# Patient Record
Sex: Male | Born: 1935 | Race: Black or African American | Hispanic: No | Marital: Single | Smoking: Never smoker
Health system: Southern US, Community
[De-identification: ages and names within clinical notes are randomized; demographics above are authoritative.]

## PROBLEM LIST (undated history)

## (undated) DIAGNOSIS — I10 Essential (primary) hypertension: Secondary | ICD-10-CM

## (undated) DIAGNOSIS — M6282 Rhabdomyolysis: Secondary | ICD-10-CM

## (undated) DIAGNOSIS — D529 Folate deficiency anemia, unspecified: Secondary | ICD-10-CM

## (undated) DIAGNOSIS — F329 Major depressive disorder, single episode, unspecified: Secondary | ICD-10-CM

## (undated) DIAGNOSIS — L723 Sebaceous cyst: Secondary | ICD-10-CM

## (undated) DIAGNOSIS — I7090 Unspecified atherosclerosis: Secondary | ICD-10-CM

## (undated) DIAGNOSIS — D638 Anemia in other chronic diseases classified elsewhere: Secondary | ICD-10-CM

## (undated) DIAGNOSIS — IMO0002 Reserved for concepts with insufficient information to code with codable children: Secondary | ICD-10-CM

## (undated) DIAGNOSIS — M79609 Pain in unspecified limb: Secondary | ICD-10-CM

## (undated) DIAGNOSIS — F3289 Other specified depressive episodes: Secondary | ICD-10-CM

## (undated) DIAGNOSIS — K922 Gastrointestinal hemorrhage, unspecified: Secondary | ICD-10-CM

## (undated) DIAGNOSIS — F1027 Alcohol dependence with alcohol-induced persisting dementia: Secondary | ICD-10-CM

## (undated) DIAGNOSIS — N189 Chronic kidney disease, unspecified: Secondary | ICD-10-CM

## (undated) HISTORY — DX: Unspecified atherosclerosis: I70.90

## (undated) HISTORY — DX: Chronic kidney disease, unspecified: N18.9

## (undated) HISTORY — DX: Other specified depressive episodes: F32.89

## (undated) HISTORY — DX: Essential (primary) hypertension: I10

## (undated) HISTORY — DX: Anemia in other chronic diseases classified elsewhere: D63.8

## (undated) HISTORY — DX: Alcohol dependence with alcohol-induced persisting dementia: F10.27

## (undated) HISTORY — DX: Major depressive disorder, single episode, unspecified: F32.9

## (undated) HISTORY — DX: Rhabdomyolysis: M62.82

## (undated) HISTORY — DX: Gastrointestinal hemorrhage, unspecified: K92.2

## (undated) HISTORY — DX: Reserved for concepts with insufficient information to code with codable children: IMO0002

## (undated) HISTORY — DX: Pain in unspecified limb: M79.609

## (undated) HISTORY — DX: Folate deficiency anemia, unspecified: D52.9

## (undated) HISTORY — DX: Sebaceous cyst: L72.3

---

## 2013-03-21 ENCOUNTER — Other Ambulatory Visit: Payer: Self-pay | Admitting: *Deleted

## 2013-03-21 DIAGNOSIS — I739 Peripheral vascular disease, unspecified: Secondary | ICD-10-CM

## 2013-03-23 ENCOUNTER — Encounter: Payer: Self-pay | Admitting: Vascular Surgery

## 2013-04-03 ENCOUNTER — Encounter: Payer: Self-pay | Admitting: Vascular Surgery

## 2013-04-04 ENCOUNTER — Encounter: Payer: Self-pay | Admitting: Vascular Surgery

## 2013-04-04 ENCOUNTER — Ambulatory Visit (HOSPITAL_COMMUNITY): Admission: RE | Admit: 2013-04-04 | Payer: Medicare Other | Source: Ambulatory Visit

## 2013-04-04 ENCOUNTER — Other Ambulatory Visit: Payer: Self-pay | Admitting: Vascular Surgery

## 2013-04-04 ENCOUNTER — Other Ambulatory Visit (HOSPITAL_COMMUNITY): Payer: Medicare Other

## 2013-04-04 ENCOUNTER — Ambulatory Visit (INDEPENDENT_AMBULATORY_CARE_PROVIDER_SITE_OTHER): Payer: Medicare Other | Admitting: Vascular Surgery

## 2013-04-04 ENCOUNTER — Encounter: Payer: Medicare Other | Admitting: Vascular Surgery

## 2013-04-04 ENCOUNTER — Encounter (HOSPITAL_COMMUNITY): Payer: Medicare Other

## 2013-04-04 ENCOUNTER — Ambulatory Visit (HOSPITAL_COMMUNITY)
Admission: RE | Admit: 2013-04-04 | Discharge: 2013-04-04 | Disposition: A | Discharge status: Home / Self Care | Payer: Medicare Other | Source: Ambulatory Visit | Attending: Vascular Surgery | Admitting: Vascular Surgery

## 2013-04-04 VITALS — BP 143/94 | HR 66 | Resp 16

## 2013-04-04 DIAGNOSIS — L97319 Non-pressure chronic ulcer of right ankle with unspecified severity: Secondary | ICD-10-CM

## 2013-04-04 DIAGNOSIS — I739 Peripheral vascular disease, unspecified: Secondary | ICD-10-CM

## 2013-04-04 DIAGNOSIS — I999 Unspecified disorder of circulatory system: Secondary | ICD-10-CM

## 2013-04-04 DIAGNOSIS — I7025 Atherosclerosis of native arteries of other extremities with ulceration: Secondary | ICD-10-CM | POA: Insufficient documentation

## 2013-04-04 DIAGNOSIS — L97309 Non-pressure chronic ulcer of unspecified ankle with unspecified severity: Secondary | ICD-10-CM

## 2013-04-04 DIAGNOSIS — I998 Other disorder of circulatory system: Secondary | ICD-10-CM

## 2013-04-04 DIAGNOSIS — L98499 Non-pressure chronic ulcer of skin of other sites with unspecified severity: Secondary | ICD-10-CM | POA: Insufficient documentation

## 2013-04-04 NOTE — Progress Notes (Signed)
[facility] [place]  Referred by:  Hortense Ramal  Reason for referral: "right heel ulcer"  History of Present Illness  [name] Griffin is a 77 y.o. (1936/02/19) male h/o L great toe amputation who presents with chief complaint: none.  This patient is demented and not able to carry on a conversation.  No caregiver came with the patient and no history came with the patient.  When asked if he has a pain, "No" which was how he asked every question.    Past Medical History  Diagnosis Date  . Hypertension   . Chronic kidney disease   . Dementia associated with alcoholism   . Atherosclerosis   . Ulcer     ankle  . Depressive disorder, not elsewhere classified   . Hemorrhage of gastrointestinal tract, unspecified   . Sebaceous cyst   . Pain in limb   . Folate-deficiency anemia   . Anemia of other chronic disease   . Rhabdomyolysis   Unclear if history complete as inadequate records and patient unable to provide history  PSH  Left great toe amputation Unclear if history complete as inadequate records and patient unable to provide history  SH and FmH cannot be obtained due to patient's dementia  Current Outpatient Prescriptions  Medication Sig Dispense Refill  . acetaminophen (TYLENOL) 325 MG suppository Place 325 mg rectally every 4 (four) hours as needed for fever.      . baci-polymyx-neo-hydrocort (CORTISPORIN) 1 % ointment every 4 (four) hours. Apply to supra-pubic catheter bid as needed      . cloNIDine (CATAPRES) 0.1 MG tablet Take 0.1 mg by mouth as needed.      . metoprolol succinate (TOPROL-XL) 100 MG 24 hr tablet Take 100 mg by mouth daily. Take with or immediately following a meal.      . Multiple Vitamin (MULTIVITAMIN) tablet Take 1 tablet by mouth daily.      Marland Kitchen omeprazole (PRILOSEC) 20 MG capsule Take 20 mg by mouth daily.      . sertraline (ZOLOFT) 50 MG tablet Take 50 mg by mouth daily.      . valsartan-hydrochlorothiazide (DIOVAN-HCT)  320-12.5 MG per tablet Take 1 tablet by mouth daily.       No current facility-administered medications for this visit.   No Known Allergies   REVIEW OF SYSTEMS:  (Positives checked otherwise negative)  Cannot be obtained due to dementia  For VQI Use Only  PRE-ADM LIVING: Nursing home  AMB STATUS: Bedridden  CAD Sx: None  PRIOR CHF: None  STRESS TEST: [x]  No, [ ]  Normal, [ ]  + ischemia, [ ]  + MI, [ ]  Both  Physical Examination  Filed Vitals:   04/04/13 1324  BP: 143/94  Pulse: 66  Resp: 16   There is no height or weight on file to calculate BMI.  General: awake, limited interaction, contracted on stretcher  Head: [place]/AT, Temporalis wasting,   Ear/Nose/Throat: Hearing grossly intact, nares w/o erythema or drainage, oropharynx w/o Erythema/Exudate, Mallampati score: 3  Eyes: PERRL, tracks with eyes but not able follow instructions for EOMI testing  Neck: Supple, no nuchal rigidity, palpable LAD  Pulmonary: Sym exp, good air movt, CTAB, no rales, rhonchi, & wheezing  Cardiac: RRR, Nl S1, S2, no Murmurs, rubs or gallops  Vascular: Vessel Right Left  Radial Faintly Palpable Faintly Palpable  Brachial Faintly Palpable Faintly Palpable  Carotid Palpable, without bruit Palpable, without bruit  Aorta Not palpable N/A  Femoral Not Palpable Faintly Palpable  Popliteal Not palpable Not palpable  PT Not Palpable Not Palpable  DP Not Palpable Not Palpable   Gastrointestinal: soft, NTND, -G/R, - HSM, - masses, - CVAT B, suprapubic catheter, erythematous groin creases with urine soaked diaper  Musculoskeletal: both upper arms folded together with some ability to unfold, both leg contracted at knees, Extremities without ischemic changes except  R lateral malleolus dried ulcer without erythema or drainage and L great toe healed amputation  Neurologic: unable to test due to dementia  Psychiatric: unable to test due to dementia  Dermatologic: groin rashes as noted  above  Lymph : No Cervical, Axillary, or Inguinal lymphadenopathy   Non-Invasive Vascular Imaging  R ABI (Date: 04/04/2013)  R: 0.66, DP: mono, PT: mono, TBI: 0.44  Outside Studies/Documentation 4 pages of outside documents were reviewed including: nursing home orders and outside duplex.  Medical Decision Making  [name] Griffin is a 77 y.o. male who presents with: RLE critical limb ischemia, R severe PAD, likely L severe PAD as evident by prior L great toe amputation   The lack of records or caregiver participation limits the consultation due to lack of history.  However, it is evident this patient does not have significant functional status, so the risk of any type of revascularization is not justifiable.  I would focus on wound care for the right heel ulcer including serial debridement +/- use of regenerative adjuncts such as Apligraf at a wound care center.  If his wound fails to heal or develops into wet gangrene, a R AKA might need to be considered.  The R knee contracture prevents any value from a BKA.  The patient can follow up with [place] as needed.  Thank you for allowing [place] to participate in this patient's care.  Leonides Sake, MD [facility] [place] Office: [phone] Pager: [phone]  04/04/2013, 1:50 PM

## 2013-06-27 ENCOUNTER — Telehealth: Payer: Self-pay

## 2013-06-27 DIAGNOSIS — I739 Peripheral vascular disease, unspecified: Secondary | ICD-10-CM

## 2013-06-27 DIAGNOSIS — L98499 Non-pressure chronic ulcer of skin of other sites with unspecified severity: Principal | ICD-10-CM

## 2013-06-27 NOTE — Telephone Encounter (Signed)
[facility] call rec'd[facility].  Reports pt. Has wound on right ankle that has worsened.  [place], and it was recommended to see the vascular surgeon again for further evaluation.  Reports pt. Is on an antibiotic at this time.  Last seen in 03/2013 per [facility].  Advised with schedule appt. For ABI's and with Dr. Imogene Burnhen 06/29/13.  Raynelle FanningJulie is to send recent notes from the Wound Care Center.

## 2013-06-27 NOTE — Telephone Encounter (Signed)
06/27/13 @ 1:45pm: left message for Raynelle [facility]Julie at [facility] regarding appt of 06/29/13 @ 3:15pm. I also asked that a POA or family member be present for appt due to pts dementia. I am waiting to hear back from [place] for confirmation, dpm

## 2013-06-28 ENCOUNTER — Encounter: Payer: Self-pay | Admitting: Vascular Surgery

## 2013-06-29 ENCOUNTER — Encounter: Payer: Self-pay | Admitting: Vascular Surgery

## 2013-06-29 ENCOUNTER — Ambulatory Visit (INDEPENDENT_AMBULATORY_CARE_PROVIDER_SITE_OTHER): Payer: Medicare Other | Admitting: Vascular Surgery

## 2013-06-29 ENCOUNTER — Ambulatory Visit (HOSPITAL_COMMUNITY)
Admission: RE | Admit: 2013-06-29 | Discharge: 2013-06-29 | Disposition: A | Discharge status: Home / Self Care | Payer: Medicare Other | Source: Ambulatory Visit | Attending: Vascular Surgery | Admitting: Vascular Surgery

## 2013-06-29 VITALS — BP 110/61 | HR 71

## 2013-06-29 DIAGNOSIS — I739 Peripheral vascular disease, unspecified: Secondary | ICD-10-CM

## 2013-06-29 DIAGNOSIS — L98499 Non-pressure chronic ulcer of skin of other sites with unspecified severity: Principal | ICD-10-CM

## 2013-06-29 DIAGNOSIS — I998 Other disorder of circulatory system: Secondary | ICD-10-CM

## 2013-06-29 DIAGNOSIS — I70229 Atherosclerosis of native arteries of extremities with rest pain, unspecified extremity: Secondary | ICD-10-CM

## 2013-06-29 DIAGNOSIS — I999 Unspecified disorder of circulatory system: Secondary | ICD-10-CM

## 2013-06-29 NOTE — Progress Notes (Signed)
Established Critical Limb Ischemia Patient  History of Present Illness  Roslyn Smilingrnest E. Varas is a 78 y.o. (1935-06-20) male who presents with chief complaint: "worsening ulcer" per care giver.  Pt is demented so history is not obtainable.   Per report, the wound care clinic has nothing more to offer and is concerned with poor progress with the lateral malleolus wound.    Past Medical History  Diagnosis Date  . Hypertension   . Chronic kidney disease   . Dementia associated with alcoholism   . Atherosclerosis   . Ulcer     ankle  . Depressive disorder, not elsewhere classified   . Hemorrhage of gastrointestinal tract, unspecified   . Sebaceous cyst   . Pain in limb   . Folate-deficiency anemia   . Anemia of other chronic disease   . Rhabdomyolysis     History reviewed. No pertinent past surgical history.  History   Social History  . Marital Status: Single    Spouse Name: N/A    Number of Children: N/A  . Years of Education: N/A   Occupational History  . Not on file.   Social History Main Topics  . Smoking status: Unknown If Ever Smoked  . Smokeless tobacco: Not on file  . Alcohol Use: Not on file  . Drug Use: Not on file  . Sexual Activity: Not on file   Other Topics Concern  . Not on file   Social History Narrative  . No narrative on file    Family History: cannot be obtained  Current Outpatient Prescriptions on File Prior to Visit  Medication Sig Dispense Refill  . acetaminophen (TYLENOL) 325 MG suppository Place 325 mg rectally every 4 (four) hours as needed for fever.      . baci-polymyx-neo-hydrocort (CORTISPORIN) 1 % ointment every 4 (four) hours. Apply to supra-pubic catheter bid as needed      . cloNIDine (CATAPRES) 0.1 MG tablet Take 0.1 mg by mouth as needed.      . metoprolol succinate (TOPROL-XL) 100 MG 24 hr tablet Take 100 mg by mouth daily. Take with or immediately following a meal.      . Multiple Vitamin (MULTIVITAMIN) tablet Take 1 tablet by  mouth daily.      Marland Kitchen. omeprazole (PRILOSEC) 20 MG capsule Take 20 mg by mouth daily.      . sertraline (ZOLOFT) 50 MG tablet Take 50 mg by mouth daily.      . valsartan-hydrochlorothiazide (DIOVAN-HCT) 320-12.5 MG per tablet Take 1 tablet by mouth daily.       No current facility-administered medications on file prior to visit.    No Known Allergies  REVIEW OF SYSTEMS:  (Positives checked otherwise negative)  Cannot be obtained due to dementia  Physical Examination  Filed Vitals:   06/29/13 1531  BP: 110/61  Pulse: 71  SpO2: 99%   There is no height or weight on file to calculate BMI.  General: awake, pleasant  Pulmonary: Sym exp, good air movt, CTAB, no rales, rhonchi, & wheezing   Cardiac: RRR, Nl S1, S2, no Murmurs, rubs or gallops   Vascular:  Vessel  Right  Left   Radial  Faintly Palpable  Faintly Palpable   Brachial  Faintly Palpable  Faintly Palpable   Carotid  Palpable, without bruit  Palpable, without bruit   Aorta  Not palpable  N/A   Femoral  Not Palpable  Faintly Palpable   Popliteal  Not palpable  Not palpable  PT  Not Palpable  Not Palpable   DP  Not Palpable  Not Palpable    Gastrointestinal: soft, NTND, -G/R, - HSM, - masses, - CVAT B, suprapubic catheter, erythematous groin creases with urine soaked diaper   Musculoskeletal: both upper arms extended,  both leg contracted at knees, frank pus draining from lateral malleolus, L great toe amputation healed  Neurologic: unable to test due to dementia   Medical Decision Making  Neziah Braley is a 78 y.o. male who presents with: RLE critical limb ischemia with likely osteomyelitis in lateral malleolus ulcer   Given the infection in right foot and limited functional status, I recommend: R AKA.  Will need to track down a POA to sign the consent as the patient is unable to do such.  As this patient has no rehab potential, he likely would be discharged back to SNF on POD #2.  Thank you for allowing [place]  to participate in this patient's care.  Leonides Sake, MD [facility] [place] Office: [phone] Pager: [phone]  06/29/2013, 4:45 PM

## 2013-07-05 ENCOUNTER — Other Ambulatory Visit: Payer: Self-pay

## 2013-07-05 ENCOUNTER — Telehealth: Payer: Self-pay | Admitting: *Deleted

## 2013-07-05 NOTE — Telephone Encounter (Signed)
Arranged with Jonette EvaCarolyn Williams to be available by telephone [phone])  on Monday, July 09, 2013 between 3:30PM and 5:00PM to give verbal (telephone) consent [facility] regarding her brother's [name] Griffin(Arne Woodyard) upcoming surgery scheduled for 07-11-2013.

## 2013-07-09 ENCOUNTER — Encounter (HOSPITAL_COMMUNITY): Payer: Self-pay | Admitting: *Deleted

## 2013-07-09 NOTE — Progress Notes (Addendum)
I spoke with Bonita QuinLinda, Mr. Elmer BalesKing's nurse to day and  Confirmed history and gave instructions for day of surgery. (see pre-op section).  I asked Bonita QuinLinda if patient had a chest Xray or EKG lately, she said she thinks [facility] [place] in prep for OR.  [facility].  Bonita QuinLinda confirmed that patinet is confused.  Bonita QuinLinda, nor Sister could report any surgeries.

## 2013-07-09 NOTE — Progress Notes (Signed)
Telephone consent was given by patient's sister Jonette EvaCarolyn Williams, patient's closest living relative. Ms Mayford KnifeWilliams gave consent to Sanjuana MaeJanice Naithan Delage, RN and Hyman Bibleeresa Crabtree, RN.

## 2013-07-10 MED ORDER — SODIUM CHLORIDE 0.9 % IV SOLN: INTRAVENOUS | Status: DC

## 2013-07-10 MED ORDER — DEXTROSE 5 % IV SOLN
1.5000 g | INTRAVENOUS | Status: DC
  Filled 2013-07-10: qty 1.5

## 2013-07-11 ENCOUNTER — Inpatient Hospital Stay (HOSPITAL_COMMUNITY): Payer: Medicare Other

## 2013-07-11 ENCOUNTER — Encounter (HOSPITAL_COMMUNITY): Payer: Medicare Other | Admitting: Certified Registered"

## 2013-07-11 ENCOUNTER — Encounter (HOSPITAL_COMMUNITY)
Admission: RE | Disposition: A | Discharge status: Skilled Nursing Facility | Payer: Self-pay | Source: Ambulatory Visit | Attending: Vascular Surgery

## 2013-07-11 ENCOUNTER — Encounter (HOSPITAL_COMMUNITY): Payer: Self-pay | Admitting: Certified Registered"

## 2013-07-11 ENCOUNTER — Other Ambulatory Visit: Payer: Self-pay | Admitting: Vascular Surgery

## 2013-07-11 ENCOUNTER — Inpatient Hospital Stay (HOSPITAL_COMMUNITY): Payer: Medicare Other | Admitting: Certified Registered"

## 2013-07-11 ENCOUNTER — Encounter (HOSPITAL_COMMUNITY): Payer: Self-pay | Admitting: Pharmacy Technician

## 2013-07-11 ENCOUNTER — Inpatient Hospital Stay (HOSPITAL_COMMUNITY)
Admission: RE | Admit: 2013-07-11 | Discharge: 2013-07-13 | DRG: 240 | Disposition: A | Discharge status: Skilled Nursing Facility | Payer: Medicare Other | Source: Ambulatory Visit | Attending: Vascular Surgery | Admitting: Vascular Surgery

## 2013-07-11 DIAGNOSIS — I998 Other disorder of circulatory system: Secondary | ICD-10-CM | POA: Diagnosis present

## 2013-07-11 DIAGNOSIS — I129 Hypertensive chronic kidney disease with stage 1 through stage 4 chronic kidney disease, or unspecified chronic kidney disease: Secondary | ICD-10-CM | POA: Diagnosis present

## 2013-07-11 DIAGNOSIS — N183 Chronic kidney disease, stage 3 unspecified: Secondary | ICD-10-CM | POA: Diagnosis present

## 2013-07-11 DIAGNOSIS — I70269 Atherosclerosis of native arteries of extremities with gangrene, unspecified extremity: Secondary | ICD-10-CM

## 2013-07-11 DIAGNOSIS — K219 Gastro-esophageal reflux disease without esophagitis: Secondary | ICD-10-CM | POA: Diagnosis present

## 2013-07-11 DIAGNOSIS — F3289 Other specified depressive episodes: Secondary | ICD-10-CM | POA: Diagnosis present

## 2013-07-11 DIAGNOSIS — I70229 Atherosclerosis of native arteries of extremities with rest pain, unspecified extremity: Secondary | ICD-10-CM | POA: Diagnosis present

## 2013-07-11 DIAGNOSIS — I7090 Unspecified atherosclerosis: Secondary | ICD-10-CM | POA: Diagnosis present

## 2013-07-11 DIAGNOSIS — E1159 Type 2 diabetes mellitus with other circulatory complications: Secondary | ICD-10-CM | POA: Diagnosis present

## 2013-07-11 DIAGNOSIS — E875 Hyperkalemia: Secondary | ICD-10-CM | POA: Diagnosis not present

## 2013-07-11 DIAGNOSIS — F1020 Alcohol dependence, uncomplicated: Secondary | ICD-10-CM | POA: Diagnosis present

## 2013-07-11 DIAGNOSIS — D638 Anemia in other chronic diseases classified elsewhere: Secondary | ICD-10-CM | POA: Diagnosis present

## 2013-07-11 DIAGNOSIS — M869 Osteomyelitis, unspecified: Secondary | ICD-10-CM | POA: Diagnosis present

## 2013-07-11 DIAGNOSIS — F1027 Alcohol dependence with alcohol-induced persisting dementia: Secondary | ICD-10-CM | POA: Diagnosis present

## 2013-07-11 DIAGNOSIS — L97309 Non-pressure chronic ulcer of unspecified ankle with unspecified severity: Secondary | ICD-10-CM | POA: Diagnosis present

## 2013-07-11 DIAGNOSIS — L98499 Non-pressure chronic ulcer of skin of other sites with unspecified severity: Secondary | ICD-10-CM

## 2013-07-11 DIAGNOSIS — D539 Nutritional anemia, unspecified: Secondary | ICD-10-CM | POA: Diagnosis present

## 2013-07-11 DIAGNOSIS — I739 Peripheral vascular disease, unspecified: Secondary | ICD-10-CM

## 2013-07-11 DIAGNOSIS — F329 Major depressive disorder, single episode, unspecified: Secondary | ICD-10-CM | POA: Diagnosis present

## 2013-07-11 DIAGNOSIS — F0390 Unspecified dementia without behavioral disturbance: Secondary | ICD-10-CM | POA: Diagnosis present

## 2013-07-11 HISTORY — PX: AMPUTATION: SHX166

## 2013-07-11 LAB — GLUCOSE, CAPILLARY
GLUCOSE-CAPILLARY: 84 mg/dL (ref 70–99)
Glucose-Capillary: 92 mg/dL (ref 70–99)
Glucose-Capillary: 82 mg/dL (ref 70–99)
Glucose-Capillary: 73 mg/dL (ref 70–99)
Glucose-Capillary: 73 mg/dL (ref 70–99)

## 2013-07-11 LAB — CBC
HEMATOCRIT: 37.8 % — AB (ref 39.0–52.0)
HEMATOCRIT: 35.7 % — AB (ref 39.0–52.0)
Hemoglobin: 12.8 g/dL — ABNORMAL LOW (ref 13.0–17.0)
Hemoglobin: 12 g/dL — ABNORMAL LOW (ref 13.0–17.0)
MCH: 29.8 pg (ref 26.0–34.0)
MCH: 29.8 pg (ref 26.0–34.0)
MCHC: 33.9 g/dL (ref 30.0–36.0)
MCHC: 33.6 g/dL (ref 30.0–36.0)
MCV: 87.9 fL (ref 78.0–100.0)
MCV: 88.6 fL (ref 78.0–100.0)
PLATELETS: 226 10*3/uL (ref 150–400)
PLATELETS: 203 10*3/uL (ref 150–400)
RBC: 4.3 MIL/uL (ref 4.22–5.81)
RBC: 4.03 MIL/uL — AB (ref 4.22–5.81)
RDW: 16.8 % — ABNORMAL HIGH (ref 11.5–15.5)
RDW: 16.7 % — AB (ref 11.5–15.5)
WBC: 6 10*3/uL (ref 4.0–10.5)
WBC: 8.9 10*3/uL (ref 4.0–10.5)

## 2013-07-11 LAB — CREATININE, SERUM
Creatinine, Ser: 2.26 mg/dL — ABNORMAL HIGH (ref 0.50–1.35)
GFR calc non Af Amer: 27 mL/min — ABNORMAL LOW (ref >90)
GFR, EST AFRICAN AMERICAN: 31 mL/min — AB (ref >90)

## 2013-07-11 SURGERY — AMPUTATION, ABOVE KNEE
Anesthesia: General | Site: Leg Upper | Laterality: Right

## 2013-07-11 MED ORDER — ONDANSETRON HCL 4 MG/2ML IJ SOLN
INTRAMUSCULAR | Status: DC | PRN
  Administered 2013-07-11: 4 mg via INTRAVENOUS

## 2013-07-11 MED ORDER — GLYCOPYRROLATE 0.2 MG/ML IJ SOLN
INTRAMUSCULAR | Status: AC
Start: 2013-07-11 — End: 2013-07-11
  Filled 2013-07-11: qty 3

## 2013-07-11 MED ORDER — DEXTROSE 5 % IV SOLN
1.5000 g | Freq: Two times a day (BID) | INTRAVENOUS | Status: AC
  Administered 2013-07-11 – 2013-07-12 (×2): 1.5 g via INTRAVENOUS
  Filled 2013-07-11 (×2): qty 1.5

## 2013-07-11 MED ORDER — ONDANSETRON HCL 4 MG/2ML IJ SOLN
INTRAMUSCULAR | Status: AC
  Filled 2013-07-11: qty 2

## 2013-07-11 MED ORDER — FENTANYL CITRATE 0.05 MG/ML IJ SOLN
INTRAMUSCULAR | Status: DC | PRN
  Administered 2013-07-11 (×2): 50 ug via INTRAVENOUS

## 2013-07-11 MED ORDER — AMLODIPINE BESYLATE 5 MG PO TABS
5.0000 mg | ORAL_TABLET | Freq: Every day | ORAL | Status: DC
  Administered 2013-07-12 – 2013-07-13 (×2): 5 mg via ORAL
  Filled 2013-07-11 (×2): qty 1

## 2013-07-11 MED ORDER — ENOXAPARIN SODIUM 40 MG/0.4ML ~~LOC~~ SOLN
40.0000 mg | SUBCUTANEOUS | Status: DC
  Administered 2013-07-11 – 2013-07-12 (×2): 40 mg via SUBCUTANEOUS
  Filled 2013-07-11 (×3): qty 0.4

## 2013-07-11 MED ORDER — DOCUSATE SODIUM 100 MG PO CAPS
100.0000 mg | ORAL_CAPSULE | Freq: Every day | ORAL | Status: DC
  Administered 2013-07-12 – 2013-07-13 (×2): 100 mg via ORAL
  Filled 2013-07-11 (×2): qty 1

## 2013-07-11 MED ORDER — CLONIDINE HCL 0.1 MG PO TABS
0.1000 mg | ORAL_TABLET | Freq: Every day | ORAL | Status: DC | PRN
  Filled 2013-07-11: qty 1

## 2013-07-11 MED ORDER — VALSARTAN-HYDROCHLOROTHIAZIDE 320-12.5 MG PO TABS: 1.0000 | ORAL_TABLET | Freq: Every day | ORAL | Status: DC

## 2013-07-11 MED ORDER — FENTANYL CITRATE 0.05 MG/ML IJ SOLN
INTRAMUSCULAR | Status: AC
  Filled 2013-07-11: qty 5

## 2013-07-11 MED ORDER — GUAIFENESIN-DM 100-10 MG/5ML PO SYRP
15.0000 mL | ORAL_SOLUTION | ORAL | Status: DC | PRN
Start: 2013-07-11 — End: 2013-07-13

## 2013-07-11 MED ORDER — CEFAZOLIN SODIUM 1-5 GM-% IV SOLN
INTRAVENOUS | Status: DC | PRN
  Administered 2013-07-11: 1.5 g via INTRAVENOUS

## 2013-07-11 MED ORDER — ALUM & MAG HYDROXIDE-SIMETH 200-200-20 MG/5ML PO SUSP: 15.0000 mL | ORAL | Status: DC | PRN

## 2013-07-11 MED ORDER — MORPHINE SULFATE 2 MG/ML IJ SOLN
2.0000 mg | INTRAMUSCULAR | Status: DC | PRN
  Administered 2013-07-11: 4 mg via INTRAVENOUS
  Filled 2013-07-11: qty 2

## 2013-07-11 MED ORDER — PHENOL 1.4 % MT LIQD
1.0000 | OROMUCOSAL | Status: DC | PRN
  Filled 2013-07-11: qty 177

## 2013-07-11 MED ORDER — LABETALOL HCL 5 MG/ML IV SOLN
10.0000 mg | INTRAVENOUS | Status: DC | PRN
  Filled 2013-07-11: qty 4

## 2013-07-11 MED ORDER — ROCURONIUM BROMIDE 50 MG/5ML IV SOLN
INTRAVENOUS | Status: AC
  Filled 2013-07-11: qty 1

## 2013-07-11 MED ORDER — HYDRALAZINE HCL 20 MG/ML IJ SOLN: 10.0000 mg | INTRAMUSCULAR | Status: DC | PRN

## 2013-07-11 MED ORDER — PROPOFOL 10 MG/ML IV BOLUS
INTRAVENOUS | Status: AC
  Filled 2013-07-11: qty 20

## 2013-07-11 MED ORDER — PHENYLEPHRINE HCL 10 MG/ML IJ SOLN
INTRAMUSCULAR | Status: DC | PRN
  Administered 2013-07-11 (×3): 80 ug via INTRAVENOUS

## 2013-07-11 MED ORDER — PANTOPRAZOLE SODIUM 40 MG PO TBEC
40.0000 mg | DELAYED_RELEASE_TABLET | Freq: Every day | ORAL | Status: DC
  Administered 2013-07-12 – 2013-07-13 (×2): 40 mg via ORAL
  Filled 2013-07-11: qty 1

## 2013-07-11 MED ORDER — METOPROLOL TARTRATE 100 MG PO TABS
100.0000 mg | ORAL_TABLET | Freq: Two times a day (BID) | ORAL | Status: DC
  Administered 2013-07-11 – 2013-07-13 (×4): 100 mg via ORAL
  Filled 2013-07-11 (×5): qty 1

## 2013-07-11 MED ORDER — OXYCODONE HCL 5 MG PO TABS: 5.0000 mg | ORAL_TABLET | ORAL | Status: DC | PRN

## 2013-07-11 MED ORDER — SERTRALINE HCL 50 MG PO TABS
50.0000 mg | ORAL_TABLET | Freq: Every day | ORAL | Status: DC
  Administered 2013-07-12 – 2013-07-13 (×2): 50 mg via ORAL
  Filled 2013-07-11 (×2): qty 1

## 2013-07-11 MED ORDER — SODIUM CHLORIDE 0.9 % IV SOLN
INTRAVENOUS | Status: DC
  Administered 2013-07-11 – 2013-07-12 (×2): via INTRAVENOUS

## 2013-07-11 MED ORDER — OXYCODONE HCL 5 MG PO TABS: 5.0000 mg | ORAL_TABLET | Freq: Once | ORAL | Status: DC | PRN

## 2013-07-11 MED ORDER — PROMETHAZINE HCL 25 MG/ML IJ SOLN: 6.2500 mg | INTRAMUSCULAR | Status: DC | PRN

## 2013-07-11 MED ORDER — MIDAZOLAM HCL 2 MG/2ML IJ SOLN
INTRAMUSCULAR | Status: AC
  Filled 2013-07-11: qty 2

## 2013-07-11 MED ORDER — PHENYLEPHRINE 40 MCG/ML (10ML) SYRINGE FOR IV PUSH (FOR BLOOD PRESSURE SUPPORT)
PREFILLED_SYRINGE | INTRAVENOUS | Status: AC
  Filled 2013-07-11: qty 10

## 2013-07-11 MED ORDER — EPHEDRINE SULFATE 50 MG/ML IJ SOLN
INTRAMUSCULAR | Status: DC | PRN
  Administered 2013-07-11 (×5): 10 mg via INTRAVENOUS

## 2013-07-11 MED ORDER — METOPROLOL TARTRATE 1 MG/ML IV SOLN: 2.0000 mg | INTRAVENOUS | Status: DC | PRN

## 2013-07-11 MED ORDER — IRBESARTAN 300 MG PO TABS
300.0000 mg | ORAL_TABLET | Freq: Every day | ORAL | Status: DC
  Administered 2013-07-13: 300 mg via ORAL
  Filled 2013-07-11 (×2): qty 1

## 2013-07-11 MED ORDER — 0.9 % SODIUM CHLORIDE (POUR BTL) OPTIME
TOPICAL | Status: DC | PRN
  Administered 2013-07-11: 1000 mL

## 2013-07-11 MED ORDER — LIDOCAINE HCL (CARDIAC) 20 MG/ML IV SOLN
INTRAVENOUS | Status: AC
  Filled 2013-07-11: qty 5

## 2013-07-11 MED ORDER — LACTATED RINGERS IV SOLN
INTRAVENOUS | Status: DC
  Administered 2013-07-11 (×2): via INTRAVENOUS

## 2013-07-11 MED ORDER — NEOSTIGMINE METHYLSULFATE 1 MG/ML IJ SOLN
INTRAMUSCULAR | Status: AC
  Filled 2013-07-11: qty 10

## 2013-07-11 MED ORDER — LIDOCAINE HCL (CARDIAC) 20 MG/ML IV SOLN
INTRAVENOUS | Status: DC | PRN
  Administered 2013-07-11: 40 mg via INTRAVENOUS

## 2013-07-11 MED ORDER — PROPOFOL 10 MG/ML IV BOLUS
INTRAVENOUS | Status: DC | PRN
  Administered 2013-07-11: 80 mg via INTRAVENOUS

## 2013-07-11 MED ORDER — ACETAMINOPHEN 325 MG PO TABS: 325.0000 mg | ORAL_TABLET | ORAL | Status: DC | PRN

## 2013-07-11 MED ORDER — HYDROMORPHONE HCL PF 1 MG/ML IJ SOLN: 0.2500 mg | INTRAMUSCULAR | Status: DC | PRN

## 2013-07-11 MED ORDER — ACETAMINOPHEN 650 MG RE SUPP: 325.0000 mg | RECTAL | Status: DC | PRN

## 2013-07-11 MED ORDER — IPRATROPIUM-ALBUTEROL 0.5-2.5 (3) MG/3ML IN SOLN: 3.0000 mL | Freq: Four times a day (QID) | RESPIRATORY_TRACT | Status: DC | PRN

## 2013-07-11 MED ORDER — NEOSTIGMINE METHYLSULFATE 1 MG/ML IJ SOLN
INTRAMUSCULAR | Status: DC | PRN
  Administered 2013-07-11: 5 mg via INTRAVENOUS

## 2013-07-11 MED ORDER — ADULT MULTIVITAMIN W/MINERALS CH
1.0000 | ORAL_TABLET | Freq: Every day | ORAL | Status: DC
  Administered 2013-07-11 – 2013-07-13 (×3): 1 via ORAL
  Filled 2013-07-11 (×3): qty 1

## 2013-07-11 MED ORDER — OXYCODONE HCL 5 MG/5ML PO SOLN: 5.0000 mg | Freq: Once | ORAL | Status: DC | PRN

## 2013-07-11 MED ORDER — ROCURONIUM BROMIDE 100 MG/10ML IV SOLN
INTRAVENOUS | Status: DC | PRN
  Administered 2013-07-11: 30 mg via INTRAVENOUS

## 2013-07-11 MED ORDER — ONDANSETRON HCL 4 MG/2ML IJ SOLN: 4.0000 mg | Freq: Four times a day (QID) | INTRAMUSCULAR | Status: DC | PRN

## 2013-07-11 MED ORDER — GLYCOPYRROLATE 0.2 MG/ML IJ SOLN
INTRAMUSCULAR | Status: DC | PRN
  Administered 2013-07-11: 1 mg via INTRAVENOUS

## 2013-07-11 MED ORDER — HYDROCHLOROTHIAZIDE 12.5 MG PO CAPS
12.5000 mg | ORAL_CAPSULE | Freq: Every day | ORAL | Status: DC
  Administered 2013-07-12 – 2013-07-13 (×2): 12.5 mg via ORAL
  Filled 2013-07-11 (×2): qty 1

## 2013-07-11 SURGICAL SUPPLY — 46 items
BANDAGE ELASTIC 4 VELCRO ST LF (GAUZE/BANDAGES/DRESSINGS) IMPLANT
BANDAGE ELASTIC 6 VELCRO ST LF (GAUZE/BANDAGES/DRESSINGS) ×3 IMPLANT
BANDAGE ESMARK 6X9 LF (GAUZE/BANDAGES/DRESSINGS) ×1 IMPLANT
BANDAGE GAUZE ELAST BULKY 4 IN (GAUZE/BANDAGES/DRESSINGS) ×3 IMPLANT
BLADE SAW SAG 29X58X.64 (BLADE) ×3 IMPLANT
BNDG COHESIVE 6X5 TAN STRL LF (GAUZE/BANDAGES/DRESSINGS) ×3 IMPLANT
BNDG ESMARK 6X9 LF (GAUZE/BANDAGES/DRESSINGS) ×3
CANISTER SUCTION 2500CC (MISCELLANEOUS) ×3 IMPLANT
CLIP TI MEDIUM 6 (CLIP) ×3 IMPLANT
COVER SURGICAL LIGHT HANDLE (MISCELLANEOUS) ×3 IMPLANT
COVER TABLE BACK 60X90 (DRAPES) IMPLANT
DRAIN CHANNEL 19F RND (DRAIN) IMPLANT
DRAPE ORTHO SPLIT 77X108 STRL (DRAPES) ×4
DRAPE PROXIMA HALF (DRAPES) IMPLANT
DRAPE SURG ORHT 6 SPLT 77X108 (DRAPES) ×2 IMPLANT
DRAPE U-SHAPE 47X51 STRL (DRAPES) ×3 IMPLANT
DRSG ADAPTIC 3X8 NADH LF (GAUZE/BANDAGES/DRESSINGS) ×3 IMPLANT
ELECT REM PT RETURN 9FT ADLT (ELECTROSURGICAL) ×3
ELECTRODE REM PT RTRN 9FT ADLT (ELECTROSURGICAL) ×1 IMPLANT
EVACUATOR SILICONE 100CC (DRAIN) IMPLANT
GLOVE BIO SURGEON STRL SZ7 (GLOVE) ×3 IMPLANT
GLOVE BIOGEL PI IND STRL 7.5 (GLOVE) ×2 IMPLANT
GLOVE BIOGEL PI INDICATOR 7.5 (GLOVE) ×4
GOWN STRL REUS W/ TWL LRG LVL3 (GOWN DISPOSABLE) ×3 IMPLANT
GOWN STRL REUS W/TWL LRG LVL3 (GOWN DISPOSABLE) ×6
KIT BASIN OR (CUSTOM PROCEDURE TRAY) ×3 IMPLANT
KIT ROOM TURNOVER OR (KITS) ×3 IMPLANT
NS IRRIG 1000ML POUR BTL (IV SOLUTION) ×3 IMPLANT
PACK GENERAL/GYN (CUSTOM PROCEDURE TRAY) ×3 IMPLANT
PAD ARMBOARD 7.5X6 YLW CONV (MISCELLANEOUS) ×3 IMPLANT
SPONGE GAUZE 4X4 12PLY (GAUZE/BANDAGES/DRESSINGS) ×6 IMPLANT
STAPLER VISISTAT 35W (STAPLE) ×3 IMPLANT
STOCKINETTE IMPERVIOUS LG (DRAPES) ×3 IMPLANT
SUT ETHILON 3 0 PS 1 (SUTURE) IMPLANT
SUT SILK 0 TIES 10X30 (SUTURE) ×3 IMPLANT
SUT SILK 2 0 (SUTURE) ×2
SUT SILK 2-0 18XBRD TIE 12 (SUTURE) ×1 IMPLANT
SUT SILK 3 0 SH CR/8 (SUTURE) ×3 IMPLANT
SUT VIC AB 2-0 CT1 18 (SUTURE) ×6 IMPLANT
SUT VIC AB 3-0 SH 18 (SUTURE) IMPLANT
SUT VIC AB 3-0 SH 27 (SUTURE) ×4
SUT VIC AB 3-0 SH 27X BRD (SUTURE) ×2 IMPLANT
TOWEL OR 17X24 6PK STRL BLUE (TOWEL DISPOSABLE) ×3 IMPLANT
TOWEL OR 17X26 10 PK STRL BLUE (TOWEL DISPOSABLE) ×3 IMPLANT
UNDERPAD 30X30 INCONTINENT (UNDERPADS AND DIAPERS) ×3 IMPLANT
WATER STERILE IRR 1000ML POUR (IV SOLUTION) ×3 IMPLANT

## 2013-07-11 NOTE — Progress Notes (Signed)
  MEDICATION RELATED CONSULT NOTE - INITIAL   Pharmacy consulted for renal antibiotic adjustment. 77yom s/p AKA to receive Zinacef for post surgical prophylaxis. Wt 61kgkg, CrCl 1424ml/min,NKA. Patient has Zinacef 1.5g IV q12h x 2 doses ordered - dosing appropriate. Pharmacy will sign off. Please reconsult if additional assistance is needed. Thanks!!  Cleon Dewulaney,  Robert, PharmD [phone] 07/11/2013

## 2013-07-11 NOTE — Progress Notes (Signed)
Patient belongings (2 bags) labeled and taken to PACU.

## 2013-07-11 NOTE — Anesthesia Postprocedure Evaluation (Signed)
  Anesthesia Post-op Note  Patient: [name] Griffin  Procedure(s) Performed: Procedure(s): AMPUTATION ABOVE KNEE- RIGHT (Right)  Patient Location: PACU  Anesthesia Type:General  Level of Consciousness: awake and confused  Airway and Oxygen Therapy: Patient Spontanous Breathing and Patient connected to nasal cannula oxygen  Post-op Pain: none  Post-op Assessment: Post-op Vital signs reviewed, Patient's Cardiovascular Status Stable, Respiratory Function Stable, Patent Airway, No signs of Nausea or vomiting and Pain level controlled  Post-op Vital Signs: Reviewed and stable  Complications: No apparent anesthesia complications

## 2013-07-11 NOTE — Transfer of Care (Signed)
Immediate Anesthesia Transfer of Care Note  Patient: [name] Griffin  Procedure(s) Performed: Procedure(s): AMPUTATION ABOVE KNEE- RIGHT (Right)  Patient Location: PACU  Anesthesia Type:General  Level of Consciousness: awake and alert   Airway & Oxygen Therapy: Patient Spontanous Breathing and Patient connected to face mask oxygen  Post-op Assessment: Report given to PACU RN and Post -op Vital signs reviewed and stable  Post vital signs: Reviewed and stable  Complications: No apparent anesthesia complications

## 2013-07-11 NOTE — H&P (View-Only) (Signed)
Established Critical Limb Ischemia Patient  History of Present Illness  [name] Griffin is a 78 y.o. (1935-06-20) male who presents with chief complaint: "worsening ulcer" per care giver.  Pt is demented so history is not obtainable.   Per report, the wound care clinic has nothing more to offer and is concerned with poor progress with the lateral malleolus wound.    Past Medical History  Diagnosis Date  . Hypertension   . Chronic kidney disease   . Dementia associated with alcoholism   . Atherosclerosis   . Ulcer     ankle  . Depressive disorder, not elsewhere classified   . Hemorrhage of gastrointestinal tract, unspecified   . Sebaceous cyst   . Pain in limb   . Folate-deficiency anemia   . Anemia of other chronic disease   . Rhabdomyolysis     History reviewed. No pertinent past surgical history.  History   Social History  . Marital Status: Single    Spouse Name: N/A    Number of Children: N/A  . Years of Education: N/A   Occupational History  . Not on file.   Social History Main Topics  . Smoking status: Unknown If Ever Smoked  . Smokeless tobacco: Not on file  . Alcohol Use: Not on file  . Drug Use: Not on file  . Sexual Activity: Not on file   Other Topics Concern  . Not on file   Social History Narrative  . No narrative on file    Family History: cannot be obtained  Current Outpatient Prescriptions on File Prior to Visit  Medication Sig Dispense Refill  . acetaminophen (TYLENOL) 325 MG suppository Place 325 mg rectally every 4 (four) hours as needed for fever.      . baci-polymyx-neo-hydrocort (CORTISPORIN) 1 % ointment every 4 (four) hours. Apply to supra-pubic catheter bid as needed      . cloNIDine (CATAPRES) 0.1 MG tablet Take 0.1 mg by mouth as needed.      . metoprolol succinate (TOPROL-XL) 100 MG 24 hr tablet Take 100 mg by mouth daily. Take with or immediately following a meal.      . Multiple Vitamin (MULTIVITAMIN) tablet Take 1 tablet by  mouth daily.      Marland Kitchen. omeprazole (PRILOSEC) 20 MG capsule Take 20 mg by mouth daily.      . sertraline (ZOLOFT) 50 MG tablet Take 50 mg by mouth daily.      . valsartan-hydrochlorothiazide (DIOVAN-HCT) 320-12.5 MG per tablet Take 1 tablet by mouth daily.       No current facility-administered medications on file prior to visit.    No Known Allergies  REVIEW OF SYSTEMS:  (Positives checked otherwise negative)  Cannot be obtained due to dementia  Physical Examination  Filed Vitals:   06/29/13 1531  BP: 110/61  Pulse: 71  SpO2: 99%   There is no height or weight on file to calculate BMI.  General: awake, pleasant  Pulmonary: Sym exp, good air movt, CTAB, no rales, rhonchi, & wheezing   Cardiac: RRR, Nl S1, S2, no Murmurs, rubs or gallops   Vascular:  Vessel  Right  Left   Radial  Faintly Palpable  Faintly Palpable   Brachial  Faintly Palpable  Faintly Palpable   Carotid  Palpable, without bruit  Palpable, without bruit   Aorta  Not palpable  N/A   Femoral  Not Palpable  Faintly Palpable   Popliteal  Not palpable  Not palpable  PT  Not Palpable  Not Palpable   DP  Not Palpable  Not Palpable    Gastrointestinal: soft, NTND, -G/R, - HSM, - masses, - CVAT B, suprapubic catheter, erythematous groin creases with urine soaked diaper   Musculoskeletal: both upper arms extended,  both leg contracted at knees, frank pus draining from lateral malleolus, L great toe amputation healed  Neurologic: unable to test due to dementia   Medical Decision Making  [name] Griffin is a 78 y.o. male who presents with: RLE critical limb ischemia with likely osteomyelitis in lateral malleolus ulcer   Given the infection in right foot and limited functional status, I recommend: R AKA.  Will need to track down a POA to sign the consent as the patient is unable to do such.  As this patient has no rehab potential, he likely would be discharged back to SNF on POD #2.  Thank you for allowing [place]  to participate in this patient's care.  Leonides Sake, MD [facility] [place] Office: [phone] Pager: [phone]  06/29/2013, 4:45 PM

## 2013-07-11 NOTE — Progress Notes (Signed)
[facility] [address] Dr Jean RosenthalJackson called and informed.

## 2013-07-11 NOTE — Progress Notes (Addendum)
    Concerns raised in regards the manner in which the consent was obtained.  The next of kin did NOT show up for the consent, rather a phone consent was obtained.  As the next of kin has never shown up for his clinic visits, Anesthesia raises valid concerns in regards to the validity of the amputation.  Despite multiple calls to his nursing home and phone numbers obtained from the facility that are reportedly the family's phone numbers, no response has been obtained.  On exam, the patient's right leg does not have ascending cellulitis and no pus draining today.  The lateral ulcer has increased in size.  I do not think his exam is consistent with imminent sepsis due to wet gangrene, so I will cancel the case at this point.    - Will not reschedule amputation until informed consent issue is resolved.  Leonides SakeBrian Shawnmichael Parenteau, MD [facility] [place] Office: [phone] Pager: [phone]  07/11/2013, 2:57 PM  Addendum  Pt's sister Jonette EvaCarolyn Williams [phone]) called and informed consent was obtained.  She agrees to proceed with the R AKA at this point.  I discussed in depth the nature of above-the-knee amputation with the patient's sister, including risks, benefits, and alternatives.  The patient's sister is aware that the risks of above-the-knee amputation include but are not limited to: bleeding, infection, myocardial infarction, stroke, death, failure to heal amputation wound, and possible need for more proximal amputation.  The patient's next of kin  is aware of the risks and agrees proceed forward with the procedure.    Leonides SakeBrian Henna Derderian, MD [facility] [place] Office: [phone] Pager: [phone]  07/11/2013, 3:15 PM

## 2013-07-11 NOTE — Interval H&P Note (Signed)
[facility] [place]  History and Physical Update  The patient was interviewed and re-examined.  The patient's previous History and Physical has been reviewed and is unchanged.  There is no change in the plan of care: R AKA.[name] Griffin.  Brian Chen, MD [facility] [place] Office: [phone] Pager: [phone]  07/11/2013, 1:08 PM

## 2013-07-11 NOTE — Anesthesia Preprocedure Evaluation (Addendum)
Anesthesia Evaluation  Patient identified by MRN, date of birth, ID band Patient confused  General Assessment Comment:Patient cannot state his name, consent obtained by Dr. Imogene Burnhen from patient's sister over telephone  Reviewed: Allergy & Precautions, H&P , NPO status , Patient's Chart, lab work & pertinent test results, reviewed documented beta blocker date and time   History of Anesthesia Complications Negative for: history of anesthetic complications  Airway Mallampati: II TM Distance: >3 FB     Dental  (+) Poor Dentition, Dental Advisory Given and Missing   Pulmonary neg pulmonary ROS,  breath sounds clear to auscultation  Pulmonary exam normal       Cardiovascular hypertension, Pt. on medications and Pt. on home beta blockers + Peripheral Vascular Disease Rhythm:Regular Rate:Normal     Neuro/Psych PSYCHIATRIC DISORDERS Depression dementiaDementia/alcoholic encephalopathy    GI/Hepatic negative GI ROS, Neg liver ROS, GERD-  ,(+)     substance abuse  alcohol use,   Endo/Other  Diabetes: glu 73.  Renal/GU      Musculoskeletal   Abdominal   Peds  Hematology   Anesthesia Other Findings   Reproductive/Obstetrics                       Anesthesia Physical Anesthesia Plan  ASA: III  Anesthesia Plan: General   Post-op Pain Management:    Induction: Intravenous  Airway Management Planned: Oral ETT  Additional Equipment:   Intra-op Plan:   Post-operative Plan: Extubation in OR  Informed Consent: I have reviewed the patients History and Physical, chart, labs and discussed the procedure including the risks, benefits and alternatives for the proposed anesthesia with the patient or authorized representative who has indicated his/her understanding and acceptance.   Dental advisory given and Consent reviewed with POA  Plan Discussed with: CRNA, Anesthesiologist and Surgeon  Anesthesia Plan  Comments: (Plan routine monitors, GETA)       Anesthesia Quick Evaluation

## 2013-07-11 NOTE — Op Note (Signed)
    OPERATIVE NOTE   PROCEDURE: Right above-the-knee amputation  PRE-OPERATIVE DIAGNOSIS: right foot gangrene  POST-OPERATIVE DIAGNOSIS: same as above  SURGEON: Leonides SakeBrian Thanh Mottern, MD  ASSISTANT(S): Lianne CureMaureen Collins, PAC   ANESTHESIA: general  ESTIMATED BLOOD LOSS: 50 cc  FINDING(S): 1.  Conversion of posterior thigh muscle to fat, viable anterior compartment muscle*  SPECIMEN(S):  right above-the-knee amputation  INDICATIONS:   [name] Griffin is a 78 y.o. male with severe dementia who presents with draining right lateral malleolus.  This patient has permanent contractures in both legs, so there is not benefit to revascularization or below-knee amputation. The patient is scheduled for a right above-the-knee amputation.  I discussed in depth with the patient the risks, benefits, and alternatives to this procedure.  The patient's family is aware that the risk of this operation included but are not limited to:  bleeding, infection, myocardial infarction, stroke, death, failure to heal amputation wound, and possible need for more proximal amputation.  The patient's family is aware of the risks and agrees proceed forward with the procedure.  DESCRIPTION: After full informed written consent was obtained from the patient, the patient was brought back to the operating room, and placed supine upon the operating table.  Prior to induction, the patient received IV antibiotics.  The patient was then prepped and draped in the standard fashion for an above-the-knee amputation.  I placed a non-sterile tourniquet on the thigh prior to the procedure.  After obtaining adequate anesthesia, the patient was prepped and draped in the standard fashion for a above-the-knee amputation.  I marked out the anterior and posterior flaps for a fish-mouth type of amputation.  I exsanguinated the leg with an Esmarch bandage and then inflated the tourniquet to 250 mm Hg.   I made the incisions for these flaps, and then dissected  through the subcutaneous tissue, fascia, and muscles circumferentially.  I elevated  the periosteal tissue 4 cm more proximal than the anterior skin flap.  I then transected the femur with a power saw at this level.  Then I smoothed out the rough edges of the bone with a rasp.  At this point, the specimen was passed off the field as the above-the-knee amputation.  At this point, I clamped all visibly bleeding arteries and veins using a combination of suture ligation with Vicryl suture and electrocautery.  The tourniquet was then deflated at this point.  Bleeding continued to be controlled with electrocautery and suture ligature.  The stump was washed off with sterile normal saline and no further active bleeding was noted.  I reapproximated the anterior and posterior fascia  with interrupted stitches of 2-0 Vicryl.  This was completed along the entire length of anterior and posterior fascia until there were no more loose space in the fascial line.  The skin was then  reapproximated with staples.  The stump was washed off and dried.  The incision was dressed with [employer] and  then fluffs were applied.  Kerlix was wrapped around the leg and then gently an ACE wrap was applied.    COMPLICATIONS: none  CONDITION: stable   Leonides SakeBrian Aiman Noe, MD [facility] [place] Office: [phone] Pager: [phone]  07/11/2013, 5:13 PM

## 2013-07-11 NOTE — Preoperative (Signed)
Beta Blockers   Reason not to administer Beta Blockers:Took lopressor 2/4 am

## 2013-07-12 ENCOUNTER — Encounter (HOSPITAL_COMMUNITY): Payer: Self-pay | Admitting: *Deleted

## 2013-07-12 LAB — CBC
HCT: 36.2 % — ABNORMAL LOW (ref 39.0–52.0)
Hemoglobin: 12.1 g/dL — ABNORMAL LOW (ref 13.0–17.0)
MCH: 29.9 pg (ref 26.0–34.0)
MCHC: 33.4 g/dL (ref 30.0–36.0)
MCV: 89.4 fL (ref 78.0–100.0)
PLATELETS: 212 10*3/uL (ref 150–400)
RBC: 4.05 MIL/uL — AB (ref 4.22–5.81)
RDW: 17.1 % — ABNORMAL HIGH (ref 11.5–15.5)
WBC: 7.6 10*3/uL (ref 4.0–10.5)

## 2013-07-12 LAB — BASIC METABOLIC PANEL
BUN: 58 mg/dL — ABNORMAL HIGH (ref 6–23)
CALCIUM: 9.3 mg/dL (ref 8.4–10.5)
CHLORIDE: 108 meq/L (ref 96–112)
CO2: 18 mEq/L — ABNORMAL LOW (ref 19–32)
Creatinine, Ser: 2.01 mg/dL — ABNORMAL HIGH (ref 0.50–1.35)
GFR calc Af Amer: 36 mL/min — ABNORMAL LOW (ref >90)
GFR calc non Af Amer: 31 mL/min — ABNORMAL LOW (ref >90)
GLUCOSE: 69 mg/dL — AB (ref 70–99)
POTASSIUM: 6.8 meq/L — AB (ref 3.7–5.3)
SODIUM: 142 meq/L (ref 137–147)

## 2013-07-12 LAB — POTASSIUM
POTASSIUM: 5.2 meq/L (ref 3.7–5.3)
Potassium: 6.4 mEq/L — ABNORMAL HIGH (ref 3.7–5.3)

## 2013-07-12 MED ORDER — SODIUM POLYSTYRENE SULFONATE 15 GM/60ML PO SUSP
30.0000 g | Freq: Once | ORAL | Status: AC
  Administered 2013-07-12: 30 g via ORAL
  Filled 2013-07-12: qty 120

## 2013-07-12 MED ORDER — INSULIN ASPART 100 UNIT/ML ~~LOC~~ SOLN
10.0000 [IU] | Freq: Once | SUBCUTANEOUS | Status: AC
Start: 2013-07-12 — End: 2013-07-12
  Administered 2013-07-12: 10 [IU] via SUBCUTANEOUS

## 2013-07-12 MED ORDER — DEXTROSE 50 % IV SOLN
0.5000 | Freq: Once | INTRAVENOUS | Status: AC
  Administered 2013-07-12: 25 mL via INTRAVENOUS
  Filled 2013-07-12: qty 50

## 2013-07-12 MED ORDER — SODIUM CHLORIDE 0.9 % IV SOLN
1.0000 g | Freq: Once | INTRAVENOUS | Status: AC
  Administered 2013-07-12: 1 g via INTRAVENOUS
  Filled 2013-07-12: qty 10

## 2013-07-12 NOTE — Progress Notes (Signed)
Clinical Social Work Department BRIEF PSYCHOSOCIAL ASSESSMENT 07/12/2013  Patient:  [name] Griffin,[name] Griffin     Account Number:  [id number]     Admit date:  07/11/2013  Clinical Social Worker:  Carren RangPURITZ,Aamari West, LCSWA  Date/Time:  07/12/2013 10:53 AM  Referred by:  Physician  Date Referred:  07/12/2013 Referred for  SNF Placement   Other Referral:   Interview type:  Family Other interview type:   CSW attempted to call patient's sister.    PSYCHOSOCIAL DATA Living Status:  FACILITY Admitted from facility:  OTHER Level of care:  Skilled Nursing Facility Primary support name:  [name] Griffin Primary support relationship to patient:  SIBLING Degree of support available:   Unable to assess    CURRENT CONCERNS Current Concerns  Post-Acute Placement   Other Concerns:    SOCIAL WORK ASSESSMENT / PLAN CSW received consult that patient is from facility and ready tomorrow. CSW reviewed chart and noticed [facility] [place], [place]. CSW called facility and they are able to take patient back tomorrow. CSW reviewed chart and noticed patient is only alert to person. CSW then called patient's sister over the phone. CSW left voicemail for patient's sister to inform of dc possibly tomorrow. Per RN, patient' s sister has been hard to contact and is possibly in [place] at this time.   Assessment/plan status:  Psychosocial Support/Ongoing Assessment of Needs Other assessment/ plan:   Information/referral to community resources:   CSW contact information    PATIENT'S/FAMILY'S RESPONSE TO PLAN OF CARE:      Maree KrabbeLindsay Cathyann Kilfoyle, MSW, Princeton[place]CSWA [phone]

## 2013-07-12 NOTE — Progress Notes (Signed)
    I have never received any page in regards to the hyperkalemia in this patient.  Though the lab was completed by 0448, no repeat lab draw to confirm the K has been ordered.  Stat K ordered.  Laboratory: BMET    Component Value Date/Time   NA 142 07/12/2013 0448   K 6.8* 07/12/2013 0448   CL 108 07/12/2013 0448   CO2 18* 07/12/2013 0448   GLUCOSE 69* 07/12/2013 0448   BUN 58* 07/12/2013 0448   CREATININE 2.01* 07/12/2013 0448   CALCIUM 9.3 07/12/2013 0448   GFRNONAA 31* 07/12/2013 0448   GFRAA 36* 07/12/2013 0448    Leonides SakeBrian Kenston Longton, MD [facility] [place] Office: [phone] Pager: [phone]  07/12/2013, 7:32 AM

## 2013-07-12 NOTE — Progress Notes (Addendum)
   Repeat K 6.4.    Treated: - Insulin 10 units SQ with D50 1/2 amp - 1 amp Calcium gluconate - Kayexelate 30 g PO  - Hold Losartan for today (pt has been on his anti-HTN chronically in SNF) - Repeat K at 1800 - Plan discussed with his current day nurse  Leonides SakeBrian Chen, MD [facility] [place] Office: [phone] Pager: [phone]  07/12/2013, 10:42 AM

## 2013-07-12 NOTE — Progress Notes (Deleted)
CSW received consult that patient is from facility and ready tomorrow. CSW reviewed chart and noticed [facility] is [facility] [place] CSW called facility and they are able to take patient back tomorrow. CSW will complete full assessment and update later.  Maree KrabbeLindsay Marketia Stallsmith, MSW, Theresia MajorsLCSWA [phone]

## 2013-07-12 NOTE — Progress Notes (Signed)
Rn Spoke with on call MD about pts K+ being 6.8. MD will update Dr. Imogene Burnhen.

## 2013-07-12 NOTE — Progress Notes (Signed)
CSW received phone call from patient's sister. CSW explained role and stated that dc is most likely tomorrow back to [facility]. Sister stated that was fine and to set up EMS transport.  Maree KrabbeLindsay Travaughn Vue, MSW, Theresia MajorsLCSWA [phone]

## 2013-07-12 NOTE — Care Management Note (Unsigned)
    Page 1 of 1   07/12/2013     4:18:09 PM   CARE MANAGEMENT NOTE 07/12/2013  Patient:  [name],Jontrell E   Account Number:  [id number]  Date Initiated:  07/12/2013  Documentation initiated by:  Kirtis Challis  Subjective/Objective Assessment:   PT ADM ON 07/11/13 S/P RT AKA.  PTA, PT RESIDES AT [facility] IN [place], [place].     Action/Plan:   CSW CONSULTED TO FACILITATE DC TO SNF WHEN MEDICALLY STABLE FOR DC.   Anticipated DC Date:  07/13/2013   Anticipated DC Plan:  SKILLED NURSING FACILITY  In-house referral  Clinical Social Worker      DC Planning Services  CM consult      Choice offered to / List presented to:             Status of service:  In process, will continue to follow Medicare Important Message given?   (If response is "NO", the following Medicare IM given date fields will be blank) Date Medicare IM given:   Date Additional Medicare IM given:    Discharge Disposition:    Per UR Regulation:  Reviewed for med. necessity/level of care/duration of stay  If discussed at Long Length of Stay Meetings, dates discussed:    Comments:

## 2013-07-12 NOTE — Progress Notes (Addendum)
   Daily Progress Note  Assessment/Planning: POD #1 s/p R AKA   Hyper K: repeat K pending, no reason for his K to be elevated (no drugs, no fluids, only chronic kidney disease stage III)  chronic kidney disease stage III: stable, no signs of ESRD  No rehab potential so rtn to SNF tomorrow  Subjective  - 1 Day Post-Op  No complaints, no events  Objective Filed Vitals:   07/11/13 1815 07/11/13 1853 07/11/13 2106 07/12/13 0432  BP: 125/83 128/83 133/82 102/76  Pulse: 76 71 85 80  Temp: 97 F (36.1 C) 97.5 F (36.4 C) 97.2 F (36.2 C) 98.1 F (36.7 C)  TempSrc:  Oral Oral Oral  Resp: 13 18 18 18   Weight:  134 lb 4.2 oz (60.9 kg)    SpO2: 99% 95% 95% 95%    Intake/Output Summary (Last 24 hours) at 07/12/13 0734 Last data filed at 07/12/13 0600  Gross per 24 hour  Intake   1275 ml  Output   1375 ml  Net   -100 ml    PULM  CTAB CV  RRR GI  soft, NTND VASC  R AKA stump bandaged, no active bleeding  Laboratory CBC    Component Value Date/Time   WBC 7.6 07/12/2013 0448   HGB 12.1* 07/12/2013 0448   HCT 36.2* 07/12/2013 0448   PLT 212 07/12/2013 0448    BMET    Component Value Date/Time   NA 142 07/12/2013 0448   K 6.8* 07/12/2013 0448   CL 108 07/12/2013 0448   CO2 18* 07/12/2013 0448   GLUCOSE 69* 07/12/2013 0448   BUN 58* 07/12/2013 0448   CREATININE 2.01* 07/12/2013 0448   CALCIUM 9.3 07/12/2013 0448   GFRNONAA 31* 07/12/2013 0448   GFRAA 36* 07/12/2013 0448    [name] SakeBrian Chen, MD [facility] [place] Office: [phone] Pager: [phone]  07/12/2013, 7:34 AM

## 2013-07-12 NOTE — Progress Notes (Signed)
Utilization Review Completed.[name] Griffin T2/10/2013  

## 2013-07-12 NOTE — Clinical Documentation Improvement (Signed)
THIS DOCUMENT IS NOT A PERMANENT PART OF THE MEDICAL RECORD  Please update your documentation with the medical record to reflect your response to this query. If you need help knowing how to do this please call [phone].  07/12/13   Dear Dr.[facility],  In a better effort to capture your patient's severity of illness, reflect appropriate length of stay and utilization of resources, a review of the patient medical record has revealed the following indicators.    Based on your clinical judgment, please clarify and document in a progress note and/or discharge summary the clinical condition associated with the following supporting information:  In responding to this query please exercise your independent judgment.  The fact that a query is asked, does not imply that any particular answer is desired or expected.   Possible Clinical Conditions?    _______CKD Stage II - GFR 60-80 _____x__CKD Stage III - GFR 30-59 _______CKD Stage IV - GFR 15-29 _______Other condition _______Cannot Clinically determine    Risk Factors:  Patient with a history of CKD noted per 01/23 H&P.  Lab: 02/05: Bun:  58 Creat: 2.01 GFR: 36  You may use possible, probable, or suspect with inpatient documentation. possible, probable, suspected diagnoses MUST be documented at the time of discharge  Reviewed: additional documentation in the medical record   Thank Gabriel CirriYou,  Wanda Mathews-Bethea,RN,BSN,Clinical Documentation Specialist: [phone] Health Information Management [place]

## 2013-07-12 NOTE — Progress Notes (Addendum)
CRITICAL VALUE ALERT  Critical value received:  K+ 6.8  Date of notification:  07/12/2013   Time of notification: 6:49 AM   Critical value read back:yes  Nurse who received alert:  Jonny RuizJohn RN  MD notified (1st page):  Dr. Imogene Burnhen   Time of first page:  6:52 AM

## 2013-07-13 ENCOUNTER — Encounter (HOSPITAL_COMMUNITY): Payer: Self-pay | Admitting: *Deleted

## 2013-07-13 LAB — CBC
HCT: 34.8 % — ABNORMAL LOW (ref 39.0–52.0)
HEMOGLOBIN: 11.7 g/dL — AB (ref 13.0–17.0)
MCH: 29.8 pg (ref 26.0–34.0)
MCHC: 33.6 g/dL (ref 30.0–36.0)
MCV: 88.5 fL (ref 78.0–100.0)
PLATELETS: 245 10*3/uL (ref 150–400)
RBC: 3.93 MIL/uL — AB (ref 4.22–5.81)
RDW: 16.8 % — ABNORMAL HIGH (ref 11.5–15.5)
WBC: 8.9 10*3/uL (ref 4.0–10.5)

## 2013-07-13 LAB — BASIC METABOLIC PANEL
BUN: 43 mg/dL — ABNORMAL HIGH (ref 6–23)
CO2: 18 mEq/L — ABNORMAL LOW (ref 19–32)
Calcium: 9.1 mg/dL (ref 8.4–10.5)
Chloride: 105 mEq/L (ref 96–112)
Creatinine, Ser: 1.76 mg/dL — ABNORMAL HIGH (ref 0.50–1.35)
GFR calc Af Amer: 42 mL/min — ABNORMAL LOW (ref >90)
GFR, EST NON AFRICAN AMERICAN: 36 mL/min — AB (ref >90)
Glucose, Bld: 108 mg/dL — ABNORMAL HIGH (ref 70–99)
POTASSIUM: 4.8 meq/L (ref 3.7–5.3)
Sodium: 140 mEq/L (ref 137–147)

## 2013-07-13 MED ORDER — HYDROCODONE-ACETAMINOPHEN 5-325 MG PO TABS: 1.0000 | ORAL_TABLET | Freq: Four times a day (QID) | ORAL | Status: AC | PRN

## 2013-07-13 NOTE — Progress Notes (Addendum)
[facility]   Subjective  - Doing well.   Objective 139/82 95 98.5 F (36.9 C) (Oral) 17 97%  Intake/Output Summary (Last 24 hours) at 07/13/13 1018 Last data filed at 07/12/13 1700  Gross per 24 hour  Intake    600 ml  Output    500 ml  Net    100 ml    Right stump with minimal blood on dressing and oozing from incision. No erythema  Dry dressing applied  Assessment/Planning: POD #2 Right AKA Return to SNF today   [name] Griffin GallantCOLLINS, [name] Griffin [facility] 07/13/2013 10:18 AM --  Laboratory Lab Results:  Recent Labs  07/12/13 0448 07/13/13 0427  WBC 7.6 8.9  HGB 12.1* 11.7*  HCT 36.2* 34.8*  PLT 212 245   BMET  Recent Labs  07/12/13 0448  07/12/13 1800 07/13/13 0427  NA 142  --   --  140  K 6.8*  < > 5.2 4.8  CL 108  --   --  105  CO2 18*  --   --  18*  GLUCOSE 69*  --   --  108*  BUN 58*  --   --  43*  CREATININE 2.01*  --   --  1.76*  CALCIUM 9.3  --   --  9.1  < > = values in this interval not displayed.  COAG No results found for this basename: INR, PROTIME   No results found for this basename: PTT   Addendum  I have independently interviewed and examined the patient, and I agree with the physician assistant's findings.  Mild venous ooze from staple line.   Viable R AKA stump.  Leonides SakeBrian Sherrita Riederer, MD [facility] [place] Office: [phone] Pager: [phone]  07/13/2013, 1:22 PM

## 2013-07-13 NOTE — Discharge Summary (Signed)
[facility] Discharge Summary   Patient ID:  [name] Griffin MRN: [id number] DOB/AGE: [birth year] 76 y.o.  Admit date: 07/11/2013 Discharge date: 07/13/2013 Date of Surgery: 07/11/2013 Surgeon: Surgeon(s): Fransisco Hertz, MD  Admission Diagnosis: Right Lower Extremity Critical Limb Ischemia Osteomyelitis Right Lateral Malleolus  Discharge Diagnoses:  Right Lower Extremity Critical Limb Ischemia Osteomyelitis Right Lateral Malleolus  Secondary Diagnoses: Past Medical History  Diagnosis Date  . Hypertension   . Chronic kidney disease   . Dementia associated with alcoholism   . Atherosclerosis   . Ulcer     ankle  . Depressive disorder, not elsewhere classified   . Hemorrhage of gastrointestinal tract, unspecified   . Sebaceous cyst   . Pain in limb   . Folate-deficiency anemia   . Anemia of other chronic disease   . Rhabdomyolysis     Procedure(s): AMPUTATION ABOVE KNEE- RIGHT  Discharged Condition: good  HPI: This 78 y/o male with lateral malleolus ulcer that was painful.  Wound care had nothing more to offer.  He is non ambulatory and has chronic contractures of both LE.  He underwent right AKA on 07/11/2013 and has had an uneventful admission.  He is taking PO's and the incision is healing well.  Return to SNF.   Hospital Course:  [name] Griffin is a 78 y.o. male is S/P Right Procedure(s): AMPUTATION ABOVE KNEE- RIGHT Extubated: POD # 0 Physical exam: Right stump with minimal blood on dressing and oozing from incision.  No erythema  Dry dressing applied  Post-op wounds healing well Pt. Ambulating, voiding and taking PO diet without difficulty. Pt pain controlled with PO pain meds. Labs as below Complications:none  Consults:     Significant Diagnostic Studies: CBC Lab Results  Component Value Date   WBC 8.9 07/13/2013   HGB 11.7* 07/13/2013   HCT 34.8* 07/13/2013   MCV 88.5 07/13/2013   PLT 245 07/13/2013    BMET    Component Value Date/Time   NA  140 07/13/2013 0427   K 4.8 07/13/2013 0427   CL 105 07/13/2013 0427   CO2 18* 07/13/2013 0427   GLUCOSE 108* 07/13/2013 0427   BUN 43* 07/13/2013 0427   CREATININE 1.76* 07/13/2013 0427   CALCIUM 9.1 07/13/2013 0427   GFRNONAA 36* 07/13/2013 0427   GFRAA 42* 07/13/2013 0427   COAG No results found for this basename: INR, PROTIME     Disposition:  Discharge to :Skilled nursing facility Discharge Orders   Future Orders Complete By Expires   Activity as tolerated - No restrictions  As directed    Call MD for:  redness, tenderness, or signs of infection (pain, swelling, bleeding, redness, odor or green/yellow discharge around incision site)  As directed    Call MD for:  severe or increased pain, loss or decreased feeling  in affected limb(s)  As directed    Call MD for:  temperature >100.5  As directed    Discharge instructions  As directed    Comments:     He may shower daily.  Change dressing daily until no drainage then it may be open to air.  He will follow up in our office for staple removal in 4 weeks.   May shower   As directed    Resume previous diet  As directed        Medication List         acetaminophen 325 MG tablet  Commonly known as:  TYLENOL  Take 650 mg  by mouth every 4 (four) hours as needed (For generalized pain).     amLODipine 5 MG tablet  Commonly known as:  NORVASC  Take 5 mg by mouth daily.     baci-polymyx-neo-hydrocort 1 % ointment  Commonly known as:  CORTISPORIN  every 4 (four) hours. Apply to supra-pubic catheter bid as needed     cilostazol 50 MG tablet  Commonly known as:  PLETAL  Take 50 mg by mouth 2 (two) times daily.     cloNIDine 0.1 MG tablet  Commonly known as:  CATAPRES  Take 0.1 mg by mouth daily as needed (for blood pressure related to unspecified essential).     HYDROcodone-acetaminophen 5-325 MG per tablet  Commonly known as:  NORCO/VICODIN  Take 1 tablet by mouth every 6 (six) hours as needed for moderate pain (for pain related to pressure  ulcer).     HYDROcodone-acetaminophen 5-325 MG per tablet  Commonly known as:  NORCO/VICODIN  Take 1 tablet by mouth every 6 (six) hours as needed for moderate pain.     ipratropium-albuterol 0.5-2.5 (3) MG/3ML Soln  Commonly known as:  DUONEB  Take 3 mLs by nebulization every 6 (six) hours as needed (for SOB/Wheezing).     metoprolol 100 MG tablet  Commonly known as:  LOPRESSOR  Take 100 mg by mouth 2 (two) times daily.     multivitamin tablet  Take 1 tablet by mouth daily.     omeprazole 20 MG capsule  Commonly known as:  PRILOSEC  Take 20 mg by mouth daily.     sertraline 50 MG tablet  Commonly known as:  ZOLOFT  Take 50 mg by mouth daily.     sulfamethoxazole-trimethoprim 800-160 MG per tablet  Commonly known as:  BACTRIM DS  Take 1 tablet by mouth 2 (two) times daily.     valsartan-hydrochlorothiazide 320-12.5 MG per tablet  Commonly known as:  DIOVAN-HCT  Take 1 tablet by mouth daily.       Verbal and written Discharge instructions given to the patient. Wound care per Discharge AVS F/U with Dr. Imogene Burnhen in 4 weeks.  SignedMosetta Pigeon: COLLINS, EMMA [name] 07/13/2013, 10:21 AM  Addendum  I have independently interviewed and examined the patient, and I agree with the physician assistant's discharge summary.  This patient with severe dementia underwent a R AKA for control of a non-healing right lateral malleolus ulcer draining pus.  His AKA was unremarkable and he recovered with any significant complications.  He did develop hyperkalemia of unknown etiology post-operatively and was managed medically.  By discharge his potassium has normalized.  His R AKA staples can be removed in 4 weeks.  Due this patient's severe dementia and hip contractures, he has no rehab. Potential and will remain bed bound.  Leonides SakeBrian Chen, MD [facility] [place] Office: [phone] Pager: [phone]  07/13/2013, 11:06 AM

## 2013-07-13 NOTE — Progress Notes (Signed)
Clinical Social Worker facilitated patient discharge by contacting the patient's sister (yesterday over the phone) and facility, [facility] [place]. Patient's sister and facility are agreeable to this plan and arranging transport via EMS . CSW will sign off, as social work intervention is no longer needed.  Maree KrabbeLindsay Markasia Carrol, MSW, Theresia MajorsLCSWA [phone]

## 2013-07-16 ENCOUNTER — Encounter (HOSPITAL_COMMUNITY): Payer: Self-pay | Admitting: Vascular Surgery

## 2013-08-09 ENCOUNTER — Encounter: Payer: Self-pay | Admitting: Vascular Surgery

## 2013-08-10 ENCOUNTER — Encounter: Payer: Self-pay | Admitting: Vascular Surgery

## 2013-08-10 ENCOUNTER — Ambulatory Visit (INDEPENDENT_AMBULATORY_CARE_PROVIDER_SITE_OTHER): Payer: Self-pay | Admitting: Vascular Surgery

## 2013-08-10 VITALS — BP 121/75 | HR 56 | Temp 98.7°F | Ht 66.0 in | Wt 134.0 lb

## 2013-08-10 DIAGNOSIS — I739 Peripheral vascular disease, unspecified: Secondary | ICD-10-CM

## 2013-08-10 DIAGNOSIS — Z89619 Acquired absence of unspecified leg above knee: Secondary | ICD-10-CM

## 2013-08-10 DIAGNOSIS — I998 Other disorder of circulatory system: Secondary | ICD-10-CM

## 2013-08-10 DIAGNOSIS — I70229 Atherosclerosis of native arteries of extremities with rest pain, unspecified extremity: Secondary | ICD-10-CM

## 2013-08-10 DIAGNOSIS — L98499 Non-pressure chronic ulcer of skin of other sites with unspecified severity: Principal | ICD-10-CM

## 2013-08-10 DIAGNOSIS — S78119A Complete traumatic amputation at level between unspecified hip and knee, initial encounter: Secondary | ICD-10-CM

## 2013-08-10 DIAGNOSIS — I999 Unspecified disorder of circulatory system: Secondary | ICD-10-CM

## 2013-08-10 NOTE — Progress Notes (Signed)
    Postoperative Visit   History of Present Illness  [name] Griffin is a 78 y.o. male who presents for postoperative follow-up for: R above-the-knee amputation (Date: 07/11/13).  The patient's wounds are healed.  Pt is non-responsive and no caretakers are with the patient.  For VQI Use Only  PRE-ADM LIVING: Nursing home  AMB STATUS: Bedridden  Physical Examination  Filed Vitals:   08/10/13 0910  BP: 121/75  Pulse: 56  Temp: 98.7 F (37.1 C)   RLE: Incision is healed.  Staples are intact.  Medical Decision Making  [name] Smilingrnest E Patnaude is a 78 y.o. male who presents s/p R above-the-knee ampuation.  The patient's stump is healing appropriately with resolution of pre-operative symptoms.  Thank you for allowing us to participate in this patient's care.  The patient has not been referred for prosthetic fitting.  The patient can follow up with us as needed.  Leonides SakeBrian Chen, MD [facility] [place] Office: [phone] Pager: [phone]  08/10/2013, 9:33 AM

## 2014-09-09 IMAGING — CR DG CHEST 1V
1 series · 1 of 1 positions shown · non-contrast
Comparison: None.

CLINICAL DATA: Hypertension

EXAM:
CHEST - 1 VIEW

[x chest ap]
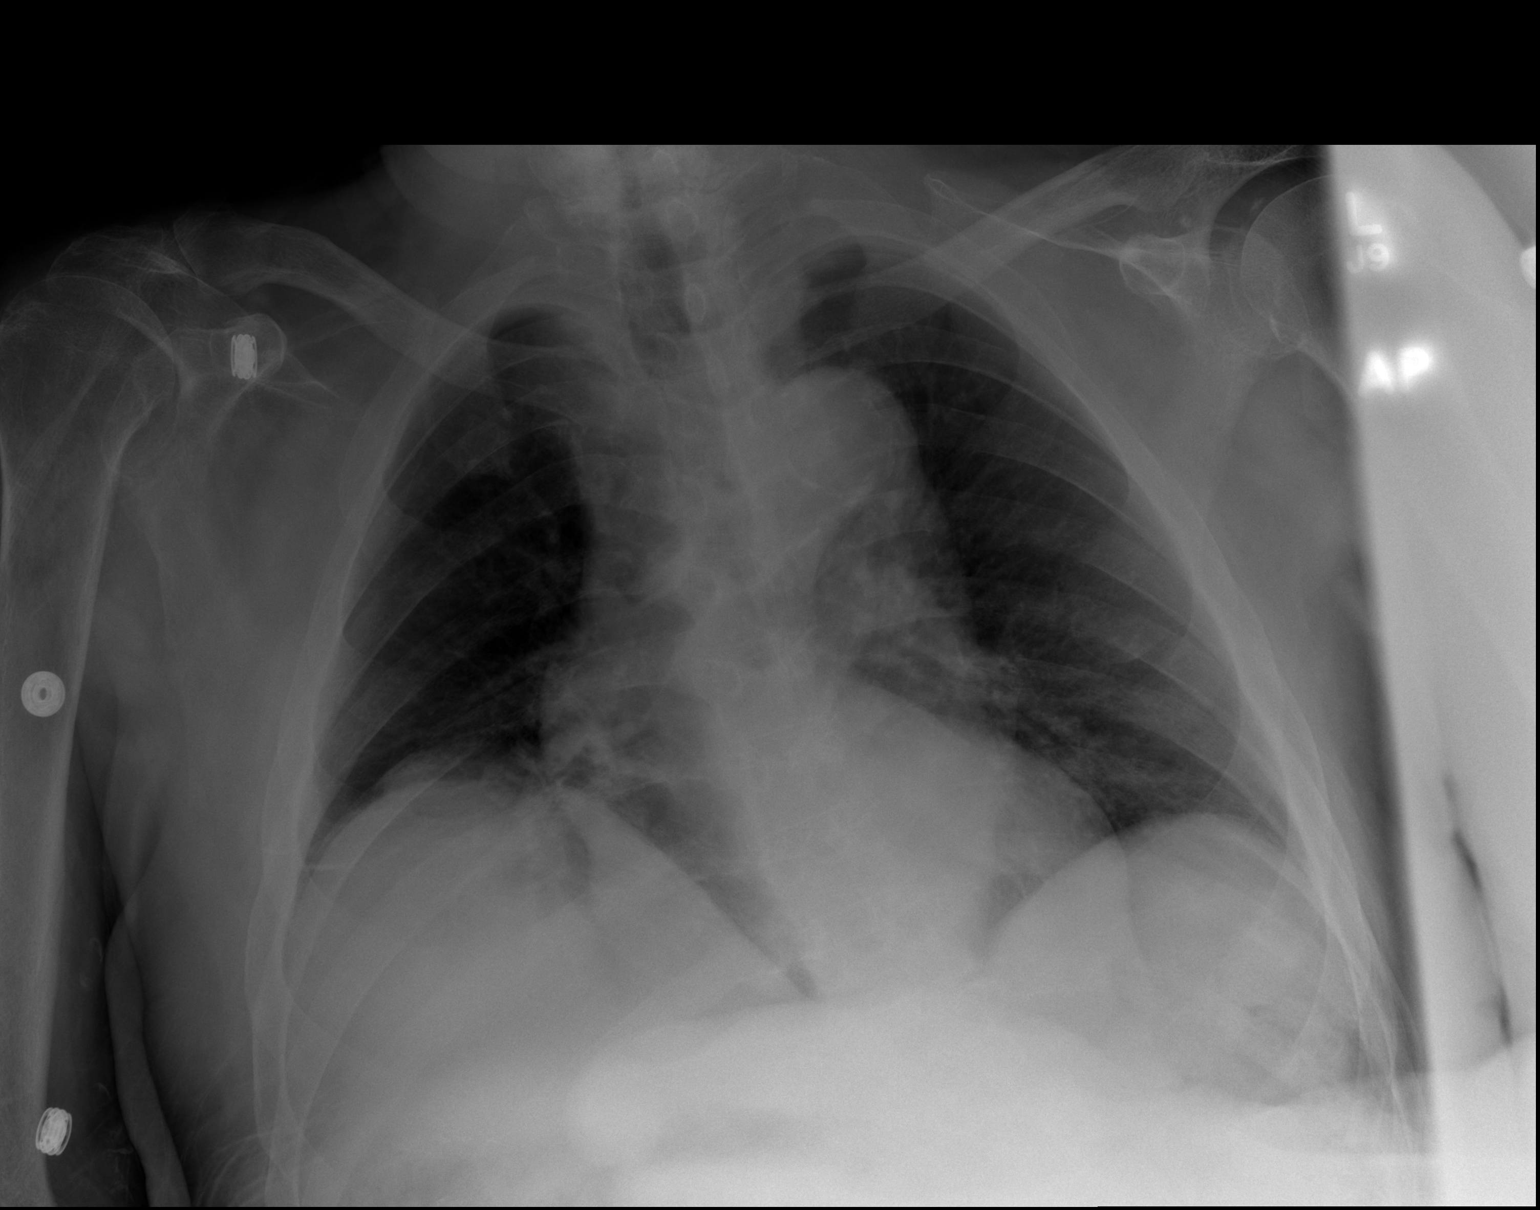

[1 of 1 positions shown; findings below may reference images not displayed]

FINDINGS: Cardiac shadow is within normal limits. Elevation of the right
hemidiaphragm is noted with some right basilar atelectasis. No focal
confluent infiltrate is seen. No acute bony abnormality is noted.
IMPRESSION: Mild right basilar atelectasis.

## 2015-02-06 DEATH — deceased
